# Patient Record
Sex: Male | Born: 2005 | Race: Black or African American | Hispanic: No | Marital: Single | Smoking: Never smoker
Health system: Southern US, Community
[De-identification: ages and names within clinical notes are randomized; demographics above are authoritative.]

## PROBLEM LIST (undated history)

## (undated) DIAGNOSIS — Q353 Cleft soft palate: Secondary | ICD-10-CM

## (undated) DIAGNOSIS — Q549 Hypospadias, unspecified: Secondary | ICD-10-CM

## (undated) HISTORY — PX: CIRCUMCISION: SUR203

## (undated) HISTORY — DX: Hypospadias, unspecified: Q54.9

## (undated) HISTORY — DX: Cleft soft palate: Q35.3

---
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
## 2024-05-14 ENCOUNTER — Ambulatory Visit: Admitting: Pediatrics
